# Patient Record
Sex: Male | Born: 1965 | Race: White | Hispanic: No | Marital: Married | Smoking: Never smoker
Health system: Southern US, Community
[De-identification: ages and names within clinical notes are randomized; demographics above are authoritative.]

## PROBLEM LIST (undated history)

## (undated) DIAGNOSIS — I10 Essential (primary) hypertension: Secondary | ICD-10-CM

## (undated) DIAGNOSIS — M6208 Separation of muscle (nontraumatic), other site: Secondary | ICD-10-CM

## (undated) HISTORY — DX: Separation of muscle (nontraumatic), other site: M62.08

---

## 2012-03-14 HISTORY — PX: HERNIA REPAIR: SHX51

## 2014-09-19 ENCOUNTER — Emergency Department (HOSPITAL_COMMUNITY)
Admission: EM | Admit: 2014-09-19 | Discharge: 2014-09-19 | Disposition: A | Discharge status: Home / Self Care | Payer: 59 | Attending: Emergency Medicine | Admitting: Emergency Medicine

## 2014-09-19 ENCOUNTER — Encounter (HOSPITAL_COMMUNITY): Payer: Self-pay | Admitting: Emergency Medicine

## 2014-09-19 ENCOUNTER — Emergency Department (HOSPITAL_COMMUNITY): Payer: 59

## 2014-09-19 DIAGNOSIS — I10 Essential (primary) hypertension: Secondary | ICD-10-CM | POA: Diagnosis not present

## 2014-09-19 DIAGNOSIS — J0141 Acute recurrent pansinusitis: Secondary | ICD-10-CM | POA: Diagnosis not present

## 2014-09-19 DIAGNOSIS — R52 Pain, unspecified: Secondary | ICD-10-CM | POA: Diagnosis not present

## 2014-09-19 DIAGNOSIS — J0191 Acute recurrent sinusitis, unspecified: Secondary | ICD-10-CM | POA: Diagnosis present

## 2014-09-19 HISTORY — DX: Essential (primary) hypertension: I10

## 2014-09-19 MED ORDER — IOHEXOL 300 MG/ML  SOLN
75.0000 mL | Freq: Once | INTRAMUSCULAR | Status: AC | PRN
  Administered 2014-09-19: 75 mL via INTRAVENOUS

## 2014-09-19 MED ORDER — LEVOFLOXACIN 500 MG PO TABS: 500.0000 mg | ORAL_TABLET | Freq: Every day | ORAL | Status: DC

## 2014-09-19 NOTE — Discharge Instructions (Signed)

## 2014-09-19 NOTE — ED Notes (Signed)
Pt reports sinus infection x 3 months, states he has been on abx with no resolution of symptoms. Pt states he has congestion but no sinus pain. Productive cough with greenish colored sputum.

## 2014-09-19 NOTE — ED Provider Notes (Signed)
CSN: [id number]     Arrival date & time 09/19/14  1130 History   None    Chief Complaint  Patient presents with  . Recurrent Sinusitis     (Consider location/radiation/quality/duration/timing/severity/associated sxs/prior Treatment) Patient is a 49 y.o. male presenting with cough. The history is provided by the patient. No language interpreter was used.  Cough Cough characteristics:  Productive Sputum characteristics:  Nondescript Severity:  Moderate Onset quality:  Gradual Timing:  Constant Progression:  Worsening Chronicity:  New Context: exposure to allergens   Relieved by:  Nothing Ineffective treatments:  None tried Associated symptoms: shortness of breath, sinus congestion and sore throat     Past Medical History  Diagnosis Date  . Hypertension    Past Surgical History  Procedure Laterality Date  . Hernia repair     Family History  Problem Relation Age of Onset  . Hypertension Mother   . Hypertension Father    History  Substance Use Topics  . Smoking status: Never Smoker   . Smokeless tobacco: Never Used  . Alcohol Use: No    Review of Systems  HENT: Positive for sore throat.   Respiratory: Positive for cough and shortness of breath.       Allergies  Calcium channel blockers and Ace inhibitors  Home Medications   Prior to Admission medications   Not on File   BP 173/118 mmHg  Pulse 73  Temp(Src) 98.5 F (36.9 C) (Oral)  Resp 18  Ht  (1.753 m)  Wt 259 lb (117.482 kg)  BMI 38.23 kg/m2  SpO2 97% Physical Exam  Constitutional: He is oriented to person, place, and time. He appears well-developed and well-nourished.  HENT:  Head: Normocephalic and atraumatic.  Right Ear: External ear normal.  Left Ear: External ear normal.  Tender maxillary and frontal sinuses  Eyes: Conjunctivae and EOM are normal. Pupils are equal, round, and reactive to light.  Neck: Normal range of motion.  Cardiovascular: Normal rate and regular rhythm.    Pulmonary/Chest: Effort normal.  Abdominal: Soft. He exhibits no distension.  Musculoskeletal: Normal range of motion.  Neurological: He is alert and oriented to person, place, and time.  Skin: Skin is warm.  Psychiatric: He has a normal mood and affect.  Nursing note and vitals reviewed.   ED Course  Procedures (including critical care time) Labs Review Labs Reviewed - No data to display  Imaging Review Dg Chest 2 View  09/19/2014   CLINICAL DATA:  Sinus infection for 3 months, productive cough  EXAM: CHEST  2 VIEW  COMPARISON:  None.  FINDINGS: Cardiomediastinal silhouette is unremarkable. Mild elevation of the right hemidiaphragm. No acute infiltrate or pleural effusion. No pulmonary edema. Mild degenerative changes thoracic spine.  IMPRESSION: No active cardiopulmonary disease.   Electronically Signed   By: Natasha Mead M.D.   On: 09/19/2014 11:51   Ct Maxillofacial W/cm  09/19/2014   CLINICAL DATA:  Sinus infection with congestion. Symptoms present for 3 months.  EXAM: CT MAXILLOFACIAL WITH CONTRAST  TECHNIQUE: Multidetector CT imaging of the maxillofacial structures was performed with intravenous contrast. Multiplanar CT image reconstructions were also generated. A small metallic BB was placed on the right temple in order to reliably differentiate right from left.  CONTRAST:  75mL OMNIPAQUE IOHEXOL 300 MG/ML  SOLN  COMPARISON:  None.  FINDINGS: There is mild-to-moderate mucosal thickening in the inferior left frontal sinus and frontal recess. Mild inferior right frontal sinus mucosal thickening is noted. There is mild mucosal thickening  in the ethmoid air cells and maxillary sinuses bilaterally. A small amount of fluid is noted in the maxillary sinuses. There is trace right and mild left sphenoid sinus mucosal thickening. Ostiomeatal complexes appear patent although there is mild infundibular narrowing bilaterally due to mucosal thickening.  There is mild rightward nasal septal deviation  anteriorly with leftward deviation and spurring more posteriorly. Mastoid air cells are clear. Orbits are unremarkable. Small bilateral upper cervical lymph nodes measure up to 9 mm in short axis, likely reactive. The orbits and visualized portion of the brain are unremarkable.  IMPRESSION: Mild mucosal thickening throughout the paranasal sinuses with a small amount of fluid in the maxillary sinuses. Correlate clinically for acute sinusitis.   Electronically Signed   By: Sebastian AcheAllen  Grady   On: 09/19/2014 13:28     EKG Interpretation None      MDM   Final diagnoses:  Pain  Acute recurrent pansinusitis    levaquin 500mg  x 10 days Pt advised to schedule to see Dr. Suszanne Connersteoh for evaluation. Continue nasal spray    Elson AreasLeslie K Maura Braaten, PA-C 09/19/14 [address]  Jessen Siegman K SalamancaSofia, PA-C 09/19/14 1501  Raeford RazorStephen Kohut, MD 09/22/14 [phone]

## 2015-12-04 IMAGING — CT CT MAXILLOFACIAL W/ CM
3 series · 16 of 47 positions shown, 19 images · IV contrast (Omnipaque 300)
Comparison: None.

CLINICAL DATA: Sinus infection with congestion. Symptoms present
for 3 months.

EXAM:
CT MAXILLOFACIAL WITH CONTRAST
TECHNIQUE: Multidetector CT imaging of the maxillofacial structures was
performed with intravenous contrast. Multiplanar CT image
reconstructions were also generated. A small metallic BB was placed
on the right temple in order to reliably differentiate right from
left.
CONTRAST:  75mL OMNIPAQUE IOHEXOL 300 MG/ML  SOLN

[Series 2: max st axial · axial · 0.37mm/px · z∈[+38,+188]mm · 10 of 87 slices shown, 13 images]
[im 6/87  brain]
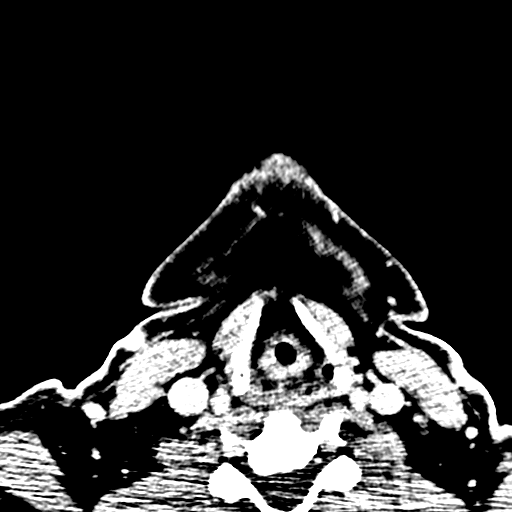
[im 6/87  bone]
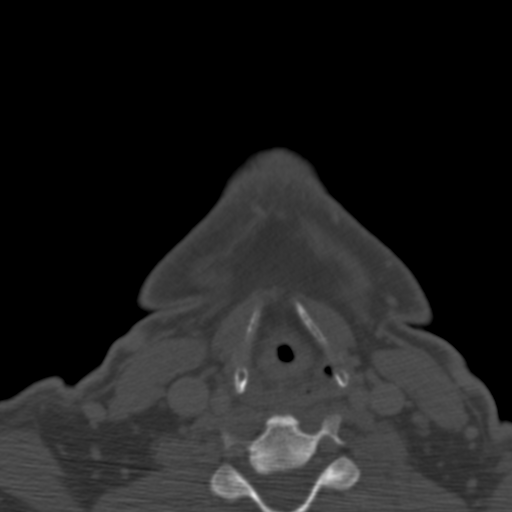
[im 15/87  bone]
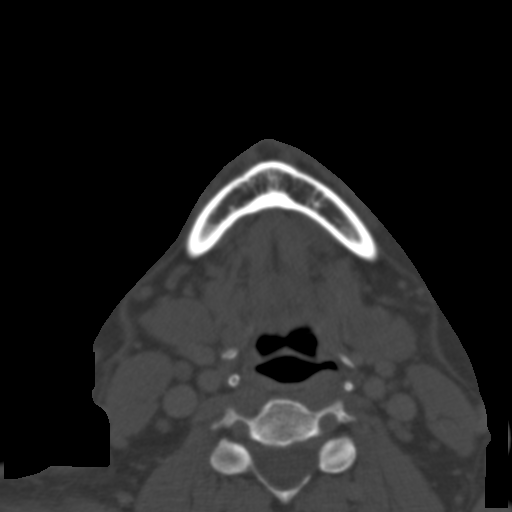
[im 24/87  bone]
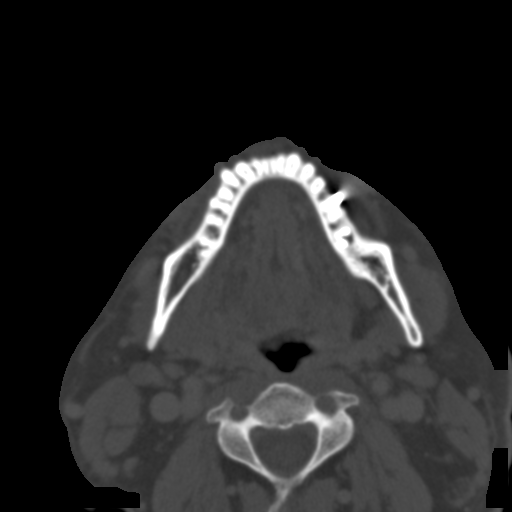
[im 30/87  bone]
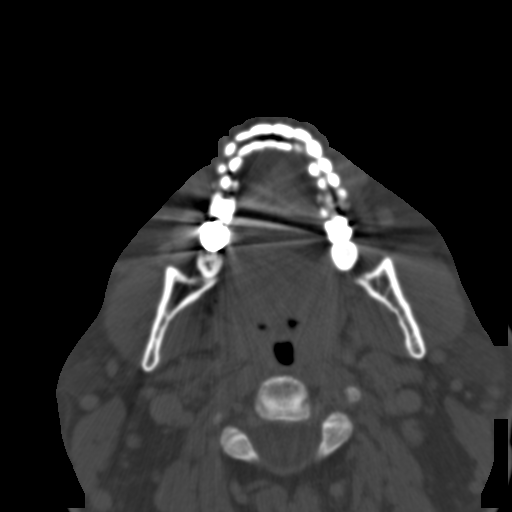
[im 39/87  brain]
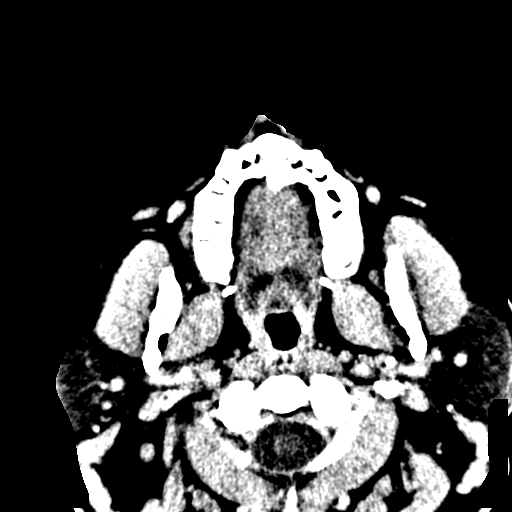
[im 39/87  bone]
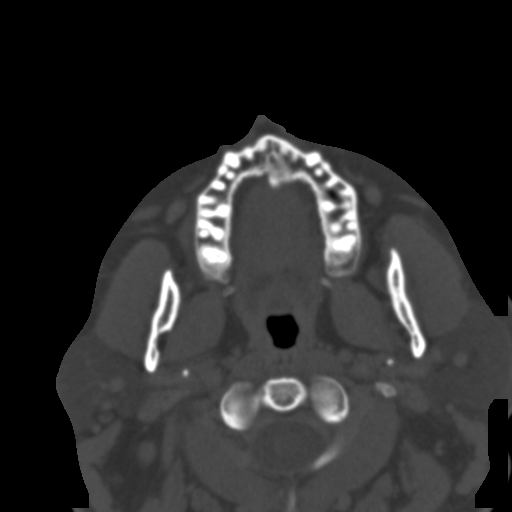
[im 48/87  bone]
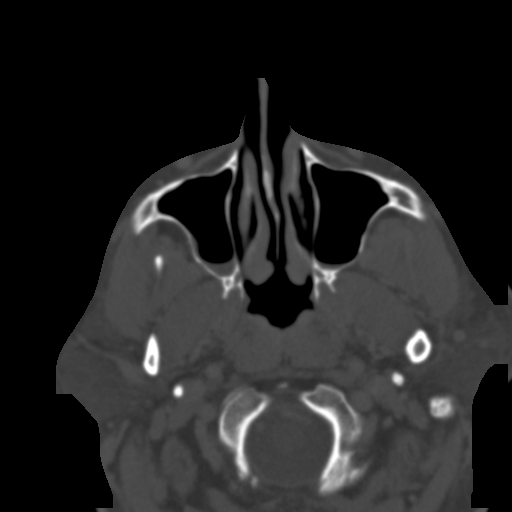
[im 57/87  bone]
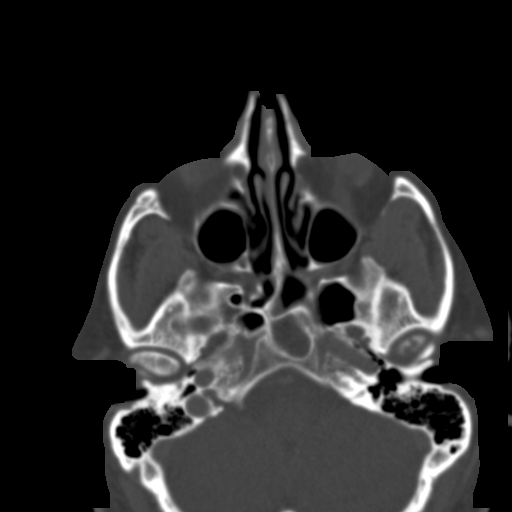
[im 66/87  bone]
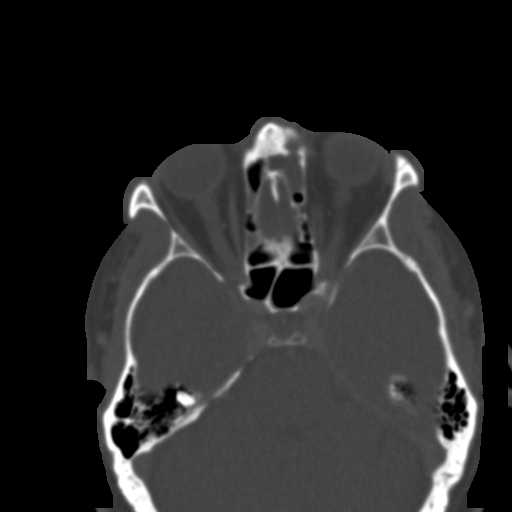
[im 72/87  brain]
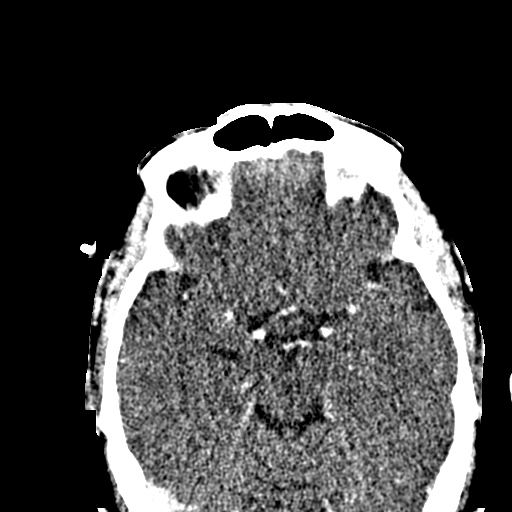
[im 72/87  bone]
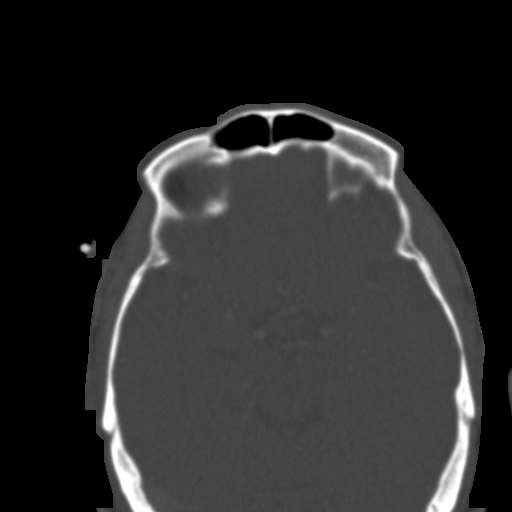
[im 81/87  bone]
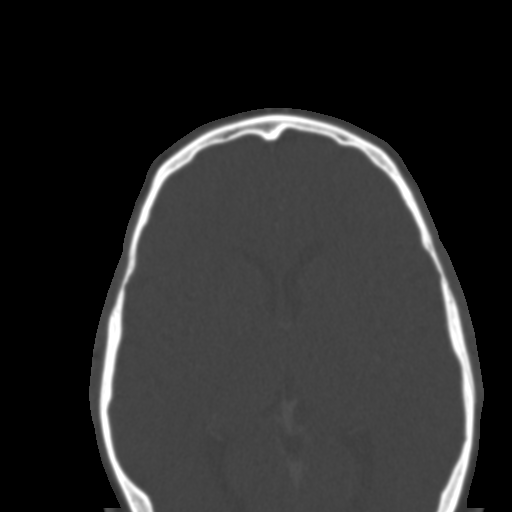

[Series 4: max st coro · coronal · 0.35mm/px · 3 of 66 slices shown]
[im 29/66  bone]
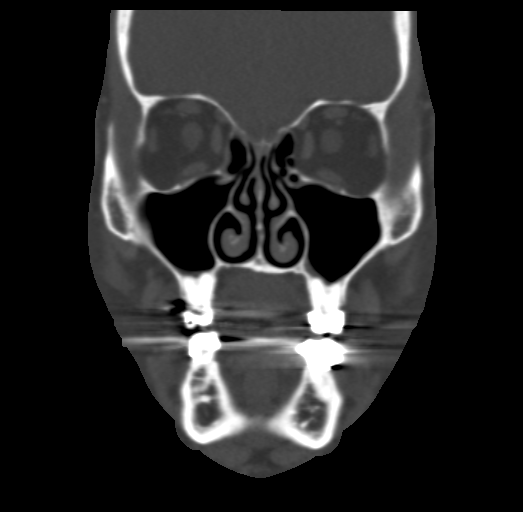
[im 37/66  bone]
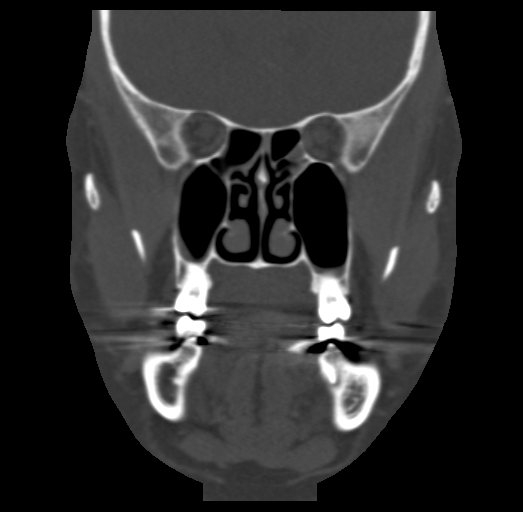
[im 44/66  bone]
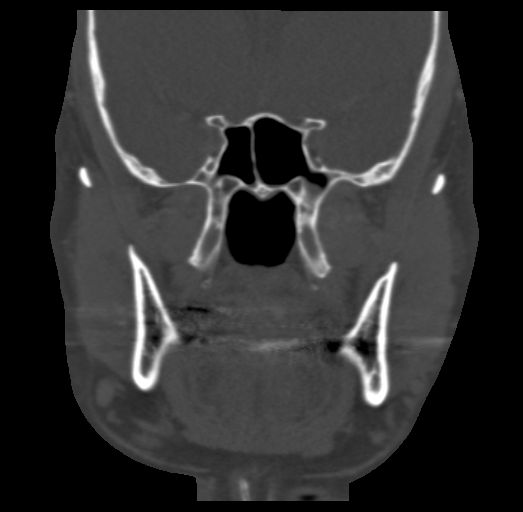

[Series 5: max st sag · sagittal · 0.34mm/px · 3 of 86 slices shown]
[im 29/86  bone]
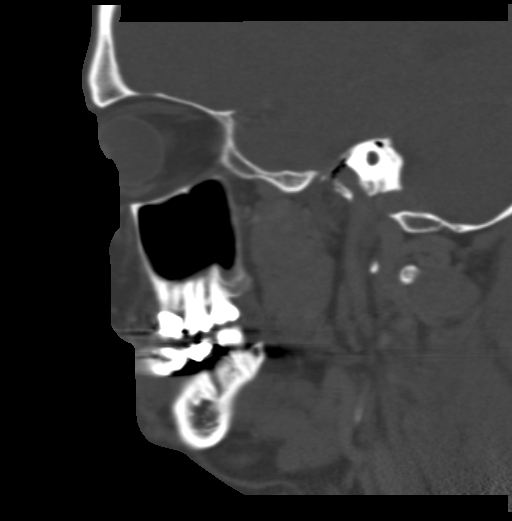
[im 43/86  bone]
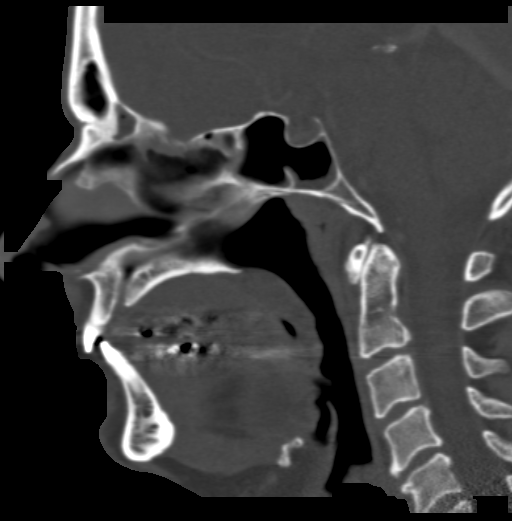
[im 57/86  bone]
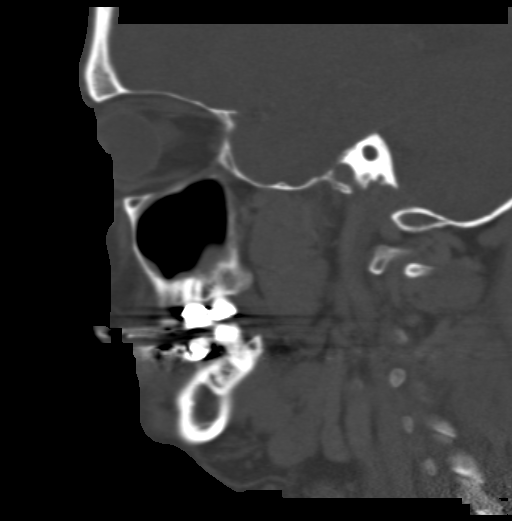

[16 of 47 positions shown; findings below may reference images not displayed]

FINDINGS: There is mild-to-moderate mucosal thickening in the inferior left
frontal sinus and frontal recess. Mild inferior right frontal sinus
mucosal thickening is noted. There is mild mucosal thickening in the
ethmoid air cells and maxillary sinuses bilaterally. A small amount
of fluid is noted in the maxillary sinuses. There is trace right and
mild left sphenoid sinus mucosal thickening. Ostiomeatal complexes
appear patent although there is mild infundibular narrowing
bilaterally due to mucosal thickening.

There is mild rightward nasal septal deviation anteriorly with
leftward deviation and spurring more posteriorly. Mastoid air cells
are clear. Orbits are unremarkable. Small bilateral upper cervical
lymph nodes measure up to 9 mm in short axis, likely reactive. The
orbits and visualized portion of the brain are unremarkable.
IMPRESSION: Mild mucosal thickening throughout the paranasal sinuses with a
small amount of fluid in the maxillary sinuses. Correlate clinically
for acute sinusitis.

## 2017-04-18 ENCOUNTER — Encounter: Payer: Self-pay | Admitting: Gastroenterology

## 2017-05-26 ENCOUNTER — Other Ambulatory Visit: Payer: Self-pay

## 2017-05-26 ENCOUNTER — Ambulatory Visit (AMBULATORY_SURGERY_CENTER): Payer: Self-pay | Admitting: *Deleted

## 2017-05-26 VITALS — Ht 69.0 in | Wt 269.0 lb

## 2017-05-26 DIAGNOSIS — R195 Other fecal abnormalities: Secondary | ICD-10-CM

## 2017-05-26 MED ORDER — PEG-KCL-NACL-NASULF-NA ASC-C 140 G PO SOLR: 1.0000 | Freq: Once | ORAL | 0 refills | Status: AC

## 2017-05-26 MED ORDER — PEG-KCL-NACL-NASULF-NA ASC-C 140 G PO SOLR: 1.0000 | Freq: Once | ORAL | 0 refills | Status: DC

## 2017-05-26 NOTE — Progress Notes (Signed)
Patient denies any allergies to eggs or soy. Patient denies any problems with anesthesia/sedation. Patient denies any oxygen use at home. Patient denies taking any diet/weight loss medications or blood thinners. EMMI education assisgned to patient on colonoscopy, this was explained and instructions given to patient. plenvu universal coupon given to pt during pv.  

## 2017-05-29 ENCOUNTER — Encounter: Payer: Self-pay | Admitting: Gastroenterology

## 2017-06-05 ENCOUNTER — Telehealth: Payer: Self-pay | Admitting: Gastroenterology

## 2017-06-05 NOTE — Telephone Encounter (Signed)
Pt had taken the Universal coupon to the Pharmacy and they had not ran it through. Explained we do not do PA for preps- pharmacist ran coupon as directed on the coupon and prep price dropped to $50 .  Will fill and get ready for pt.  Mitchell Mckenzie PV

## 2017-06-09 ENCOUNTER — Ambulatory Visit (AMBULATORY_SURGERY_CENTER): Payer: BLUE CROSS/BLUE SHIELD | Admitting: Gastroenterology

## 2017-06-09 ENCOUNTER — Encounter: Payer: Self-pay | Admitting: Gastroenterology

## 2017-06-09 ENCOUNTER — Other Ambulatory Visit: Payer: Self-pay

## 2017-06-09 VITALS — BP 157/94 | HR 75 | Temp 99.1°F | Resp 15 | Ht 69.0 in | Wt 269.0 lb

## 2017-06-09 DIAGNOSIS — R195 Other fecal abnormalities: Secondary | ICD-10-CM

## 2017-06-09 MED ORDER — SODIUM CHLORIDE 0.9 % IV SOLN: 500.0000 mL | Freq: Once | INTRAVENOUS | Status: AC

## 2017-06-09 NOTE — Progress Notes (Signed)
Pt's states no medical or surgical changes since previsit or office visit. 

## 2017-06-09 NOTE — Op Note (Signed)
[facility] Patient Name: Mitchell Mckenzie Procedure Date: 06/09/2017 1:35 PM MRN: [id number] Endoscopist: Napoleon Form , MD Age: 52 Referring MD:  Date of Birth: 1966/01/06 Gender: Male Account #: [id number] Procedure:                Colonoscopy Indications:              Positive Cologuard test Medicines:                Monitored Anesthesia Care Procedure:                Pre-Anesthesia Assessment:                           - Prior to the procedure, a History and Physical                            was performed, and patient medications and                            allergies were reviewed. The patient's tolerance of                            previous anesthesia was also reviewed. The risks                            and benefits of the procedure and the sedation                            options and risks were discussed with the patient.                            All questions were answered, and informed consent                            was obtained. Prior Anticoagulants: The patient has                            taken no previous anticoagulant or antiplatelet                            agents. ASA Grade Assessment: III - A patient with                            severe systemic disease. After reviewing the risks                            and benefits, the patient was deemed in                            satisfactory condition to undergo the procedure.                           After obtaining informed consent, the colonoscope  was passed under direct vision. Throughout the                            procedure, the patient's blood pressure, pulse, and                            oxygen saturations were monitored continuously. The                            Colonoscope was introduced through the anus and                            advanced to the the cecum, identified by                            appendiceal orifice and ileocecal valve. The                             colonoscopy was performed without difficulty. The                            patient tolerated the procedure well. The quality                            of the bowel preparation was excellent. The                            ileocecal valve, appendiceal orifice, and rectum                            were photographed. Scope In: 2:04:42 PM Scope Out: 2:17:18 PM Scope Withdrawal Time: 0 hours 9 minutes 42 seconds  Total Procedure Duration: 0 hours 12 minutes 36 seconds  Findings:                 The perianal and digital rectal examinations were                            normal.                           Scattered small and large-mouthed diverticula were                            found in the sigmoid colon, descending colon,                            transverse colon and ascending colon.                            Peri-diverticular erythema was seen.                           Non-bleeding internal hemorrhoids were found during  retroflexion. The hemorrhoids were small.                           The exam was otherwise without abnormality. Complications:            No immediate complications. Estimated Blood Loss:     Estimated blood loss: none. Impression:               - Mild diverticulosis in the sigmoid colon, in the                            descending colon, in the transverse colon and in                            the ascending colon. Peri-diverticular erythema was                            seen.                           - Non-bleeding internal hemorrhoids.                           - The examination was otherwise normal.                           - No specimens collected. Recommendation:           - Patient has a contact number available for                            emergencies. The signs and symptoms of potential                            delayed complications were discussed with the                            patient.  Return to normal activities tomorrow.                            Written discharge instructions were provided to the                            patient.                           - Resume previous diet.                           - Continue present medications.                           - Repeat colonoscopy in 10 years for screening                            purposes. Napoleon Form, MD 06/09/2017 2:22:44 PM This report has been signed electronically.

## 2017-06-09 NOTE — Progress Notes (Signed)
BP is elevated. Second BP is 172/110. No headache. No chest pain. Kathlene NovemberBill Walton, CRNA is aware, and he ill speak to the patient.

## 2017-06-09 NOTE — Progress Notes (Signed)
To recovery, report to RN, VSS. 

## 2017-06-09 NOTE — Patient Instructions (Signed)
YOU HAD AN ENDOSCOPIC PROCEDURE TODAY AT THE [facility]:   Refer to the procedure report that was given to you for any specific questions about what was found during the examination.  If the procedure report does not answer your questions, please call your gastroenterologist to clarify.  If you requested that your care partner not be given the details of your procedure findings, then the procedure report has been included in a sealed envelope for you to review at your convenience later.  YOU SHOULD EXPECT: Some feelings of bloating in the abdomen. Passage of more gas than usual.  Walking can help get rid of the air that was put into your GI tract during the procedure and reduce the bloating. If you had a lower endoscopy (such as a colonoscopy or flexible sigmoidoscopy) you may notice spotting of blood in your stool or on the toilet paper. If you underwent a bowel prep for your procedure, you may not have a normal bowel movement for a few days.  Please Note:  You might notice some irritation and congestion in your nose or some drainage.  This is from the oxygen used during your procedure.  There is no need for concern and it should clear up in a day or so.  SYMPTOMS TO REPORT IMMEDIATELY:   Following lower endoscopy (colonoscopy or flexible sigmoidoscopy):  Excessive amounts of blood in the stool  Significant tenderness or worsening of abdominal pains  Swelling of the abdomen that is new, acute  Fever of 100F or higher   For urgent or emergent issues, a gastroenterologist can be reached at any hour by calling [phone].   DIET:  We do recommend a small meal at first, but then you may proceed to your regular diet.  Drink plenty of fluids but you should avoid alcoholic beverages for 24 hours.  ACTIVITY:  You should plan to take it easy for the rest of today and you should NOT DRIVE or use heavy machinery until tomorrow (because of the sedation medicines used during the test).     FOLLOW UP: Our staff will call the number listed on your records the next business day following your procedure to check on you and address any questions or concerns that you may have regarding the information given to you following your procedure. If we do not reach you, we will leave a message.  However, if you are feeling well and you are not experiencing any problems, there is no need to return our call.  We will assume that you have returned to your regular daily activities without incident.  If any biopsies were taken you will be contacted by phone or by letter within the next 1-3 weeks.  Please call us at [phone] if you have not heard about the biopsies in 3 weeks.    SIGNATURES/CONFIDENTIALITY: You and/or your care partner have signed paperwork which will be entered into your electronic medical record.  These signatures attest to the fact that that the information above on your After Visit Summary has been reviewed and is understood.  Full responsibility of the confidentiality of this discharge information lies with you and/or your care-partner.  Diverticulosis and hemorrhoid information given.  Recall colonoscopy 10 years-2029

## 2017-06-12 ENCOUNTER — Telehealth: Payer: Self-pay

## 2017-06-12 NOTE — Telephone Encounter (Signed)
  Follow up Call-  Call back number 06/09/2017  Post procedure Call Back phone  # [phone]  Permission to leave phone message Yes     Patient questions:  Do you have a fever, pain , or abdominal swelling? No. Pain Score  0 *  Have you tolerated food without any problems? Yes.    Have you been able to return to your normal activities? Yes.    Do you have any questions about your discharge instructions: Diet   No. Medications  No. Follow up visit  No.  Do you have questions or concerns about your Care? No.  Actions: * If pain score is 4 or above: No action needed, pain <4.
